# Patient Record
Sex: Female | Born: 1970 | Race: White | Hispanic: No | Marital: Married | Smoking: Current every day smoker
Health system: Southern US, Community
[De-identification: ages and names within clinical notes are randomized; demographics above are authoritative.]

## PROBLEM LIST (undated history)

## (undated) DIAGNOSIS — N63 Unspecified lump in unspecified breast: Secondary | ICD-10-CM

## (undated) DIAGNOSIS — E079 Disorder of thyroid, unspecified: Secondary | ICD-10-CM

## (undated) DIAGNOSIS — R0683 Snoring: Secondary | ICD-10-CM

## (undated) HISTORY — PX: NO PAST SURGERIES: SHX2092

## (undated) HISTORY — DX: Disorder of thyroid, unspecified: E07.9

## (undated) HISTORY — DX: Unspecified lump in unspecified breast: N63.0

---

## 2002-12-28 ENCOUNTER — Emergency Department (HOSPITAL_COMMUNITY): Admission: EM | Admit: 2002-12-28 | Discharge: 2002-12-28 | Payer: Self-pay | Admitting: Emergency Medicine

## 2002-12-28 ENCOUNTER — Encounter: Payer: Self-pay | Admitting: Emergency Medicine

## 2005-07-19 ENCOUNTER — Encounter (HOSPITAL_COMMUNITY): Admission: RE | Admit: 2005-07-19 | Discharge: 2005-10-17 | Payer: Self-pay | Admitting: Endocrinology

## 2007-02-07 ENCOUNTER — Emergency Department (HOSPITAL_COMMUNITY): Admission: EM | Admit: 2007-02-07 | Discharge: 2007-02-07 | Payer: Self-pay | Admitting: Emergency Medicine

## 2010-06-27 ENCOUNTER — Encounter: Payer: Self-pay | Admitting: Endocrinology

## 2010-09-10 ENCOUNTER — Emergency Department (HOSPITAL_COMMUNITY): Payer: Self-pay

## 2010-09-10 ENCOUNTER — Emergency Department (HOSPITAL_COMMUNITY)
Admission: EM | Admit: 2010-09-10 | Discharge: 2010-09-11 | Disposition: A | Discharge status: Home / Self Care | Payer: Self-pay | Attending: Emergency Medicine | Admitting: Emergency Medicine

## 2010-09-10 DIAGNOSIS — J189 Pneumonia, unspecified organism: Secondary | ICD-10-CM | POA: Insufficient documentation

## 2010-09-10 DIAGNOSIS — Z7982 Long term (current) use of aspirin: Secondary | ICD-10-CM | POA: Insufficient documentation

## 2010-09-10 DIAGNOSIS — F17200 Nicotine dependence, unspecified, uncomplicated: Secondary | ICD-10-CM | POA: Insufficient documentation

## 2010-09-10 DIAGNOSIS — R079 Chest pain, unspecified: Secondary | ICD-10-CM | POA: Insufficient documentation

## 2010-09-10 DIAGNOSIS — M549 Dorsalgia, unspecified: Secondary | ICD-10-CM | POA: Insufficient documentation

## 2010-09-10 DIAGNOSIS — E0500 Thyrotoxicosis with diffuse goiter without thyrotoxic crisis or storm: Secondary | ICD-10-CM | POA: Insufficient documentation

## 2010-09-10 LAB — POCT CARDIAC MARKERS
Myoglobin, poc: 32.8 ng/mL (ref 12–200)
Troponin i, poc: <0.05 ng/mL (ref 0.00–0.09)

## 2010-09-10 LAB — POCT I-STAT, CHEM 8
BUN: 12 mg/dL (ref 6–23)
Calcium, Ion: 1.23 mmol/L (ref 1.12–1.32)
Creatinine, Ser: 0.8 mg/dL (ref 0.4–1.2)
Glucose, Bld: 115 mg/dL — ABNORMAL HIGH (ref 70–99)
Hemoglobin: 14.3 g/dL (ref 12.0–15.0)
Potassium: 3.6 mEq/L (ref 3.5–5.1)
TCO2: 26 mmol/L (ref 0–100)

## 2010-09-11 LAB — POCT CARDIAC MARKERS

## 2013-05-07 ENCOUNTER — Other Ambulatory Visit: Payer: Self-pay | Admitting: Family Medicine

## 2013-05-07 DIAGNOSIS — Z1231 Encounter for screening mammogram for malignant neoplasm of breast: Secondary | ICD-10-CM

## 2013-06-24 ENCOUNTER — Ambulatory Visit
Admission: RE | Admit: 2013-06-24 | Discharge: 2013-06-24 | Disposition: A | Discharge status: Home / Self Care | Payer: Self-pay | Source: Ambulatory Visit | Attending: Family Medicine | Admitting: Family Medicine

## 2013-06-24 DIAGNOSIS — Z1231 Encounter for screening mammogram for malignant neoplasm of breast: Secondary | ICD-10-CM

## 2013-06-26 ENCOUNTER — Other Ambulatory Visit: Payer: Self-pay | Admitting: Family Medicine

## 2013-06-26 DIAGNOSIS — R928 Other abnormal and inconclusive findings on diagnostic imaging of breast: Secondary | ICD-10-CM

## 2013-07-05 ENCOUNTER — Ambulatory Visit
Admission: RE | Admit: 2013-07-05 | Discharge: 2013-07-05 | Disposition: A | Discharge status: Home / Self Care | Payer: BC Managed Care – PPO | Source: Ambulatory Visit | Attending: Family Medicine | Admitting: Family Medicine

## 2013-07-05 DIAGNOSIS — R928 Other abnormal and inconclusive findings on diagnostic imaging of breast: Secondary | ICD-10-CM

## 2013-07-22 ENCOUNTER — Telehealth (INDEPENDENT_AMBULATORY_CARE_PROVIDER_SITE_OTHER): Payer: Self-pay | Admitting: General Surgery

## 2013-07-22 ENCOUNTER — Encounter (INDEPENDENT_AMBULATORY_CARE_PROVIDER_SITE_OTHER): Payer: Self-pay

## 2013-07-22 ENCOUNTER — Encounter (INDEPENDENT_AMBULATORY_CARE_PROVIDER_SITE_OTHER): Payer: Self-pay | Admitting: General Surgery

## 2013-07-22 ENCOUNTER — Ambulatory Visit (INDEPENDENT_AMBULATORY_CARE_PROVIDER_SITE_OTHER): Payer: BC Managed Care – PPO | Admitting: General Surgery

## 2013-07-22 VITALS — BP 132/86 | HR 78 | Temp 98.1°F | Resp 16 | Ht 65.0 in | Wt 161.6 lb

## 2013-07-22 DIAGNOSIS — D241 Benign neoplasm of right breast: Secondary | ICD-10-CM

## 2013-07-22 DIAGNOSIS — D249 Benign neoplasm of unspecified breast: Secondary | ICD-10-CM

## 2013-07-22 MED ORDER — CEFAZOLIN SODIUM 1 G IJ SOLR: 2.0000 g | INTRAMUSCULAR | Status: DC

## 2013-07-22 NOTE — Addendum Note (Signed)
Addended by: Doreen Salvage on: 07/22/2013 12:30 PM   Modules accepted: Orders

## 2013-07-22 NOTE — Progress Notes (Signed)
Subjective:     Patient ID: Beverly Mitchell, female   DOB: [birth year], 43 y.o.   MRN: [id number]  HPI The patient comes in for evaluation of a lump in her right breast. The patient had felt something in that area for a while however he was not very discrete. Subsequent workup demonstrated likely fibroadenoma. She is here for evaluation.  She has no family history of breast disease.  Review of Systems No history of nipple discharge, dimpling, or significant problems with her breasts. She does have more pain in her right breast in her left recently.    Objective:   Physical Exam Bilateral breast examination demonstrates a lateral fibrocystic change was significantly more tenderness on the right side. There is no inversion of the nipple. No peau d'orange orange change.  She has no axillary adenopathy. There is no discrete mass palpated on either breast.    Assessment:     Normal fibroadenoma of the right breast without discrete palpation of the same mass in the right breast.     Plan:     If the patient wants this mass removed then a wire localization will be necessary.  The patient wants to have the mass removed. A wire localization biopsy will be necessary. This will be scheduled as an outpatient as soon as possible.

## 2013-07-22 NOTE — Telephone Encounter (Signed)
Patient met with surgery scheduling went over financial responsibilities , patient will call back to surgery

## 2013-08-14 ENCOUNTER — Other Ambulatory Visit (INDEPENDENT_AMBULATORY_CARE_PROVIDER_SITE_OTHER): Payer: Self-pay | Admitting: General Surgery

## 2013-08-14 DIAGNOSIS — D241 Benign neoplasm of right breast: Secondary | ICD-10-CM

## 2013-09-13 ENCOUNTER — Encounter (HOSPITAL_BASED_OUTPATIENT_CLINIC_OR_DEPARTMENT_OTHER): Payer: Self-pay | Admitting: *Deleted

## 2013-09-13 NOTE — Progress Notes (Signed)
To come in for [facility]-labs,cxr,ekg

## 2013-09-16 ENCOUNTER — Ambulatory Visit
Admission: RE | Admit: 2013-09-16 | Discharge: 2013-09-16 | Disposition: A | Discharge status: Home / Self Care | Payer: BC Managed Care – PPO | Source: Ambulatory Visit | Attending: General Surgery | Admitting: General Surgery

## 2013-09-16 ENCOUNTER — Encounter (HOSPITAL_BASED_OUTPATIENT_CLINIC_OR_DEPARTMENT_OTHER): Payer: Self-pay | Admitting: *Deleted

## 2013-09-16 ENCOUNTER — Ambulatory Visit (HOSPITAL_COMMUNITY): Admission: RE | Admit: 2013-09-16 | Payer: BC Managed Care – PPO | Source: Ambulatory Visit

## 2013-09-16 ENCOUNTER — Encounter (HOSPITAL_BASED_OUTPATIENT_CLINIC_OR_DEPARTMENT_OTHER)
Admission: RE | Admit: 2013-09-16 | Discharge: 2013-09-16 | Disposition: A | Discharge status: Home / Self Care | Payer: BC Managed Care – PPO | Source: Ambulatory Visit | Attending: General Surgery | Admitting: General Surgery

## 2013-09-16 ENCOUNTER — Other Ambulatory Visit (INDEPENDENT_AMBULATORY_CARE_PROVIDER_SITE_OTHER): Payer: Self-pay | Admitting: General Surgery

## 2013-09-16 DIAGNOSIS — Z01811 Encounter for preprocedural respiratory examination: Secondary | ICD-10-CM

## 2013-09-16 DIAGNOSIS — Z01812 Encounter for preprocedural laboratory examination: Secondary | ICD-10-CM | POA: Insufficient documentation

## 2013-09-16 DIAGNOSIS — Z01810 Encounter for preprocedural cardiovascular examination: Secondary | ICD-10-CM | POA: Insufficient documentation

## 2013-09-16 LAB — CBC WITH DIFFERENTIAL/PLATELET
Basophils Absolute: 0 10*3/uL (ref 0.0–0.1)
Basophils Relative: 0 % (ref 0–1)
EOS ABS: 0 10*3/uL (ref 0.0–0.7)
Eosinophils Relative: 0 % (ref 0–5)
HCT: 42.7 % (ref 36.0–46.0)
Hemoglobin: 14.8 g/dL (ref 12.0–15.0)
LYMPHS ABS: 2.9 10*3/uL (ref 0.7–4.0)
LYMPHS PCT: 27 % (ref 12–46)
MCH: 31.9 pg (ref 26.0–34.0)
MCHC: 34.7 g/dL (ref 30.0–36.0)
MCV: 92 fL (ref 78.0–100.0)
Monocytes Absolute: 0.4 10*3/uL (ref 0.1–1.0)
Monocytes Relative: 4 % (ref 3–12)
NEUTROS PCT: 69 % (ref 43–77)
Neutro Abs: 7.6 10*3/uL (ref 1.7–7.7)
Platelets: 236 10*3/uL (ref 150–400)
RBC: 4.64 MIL/uL (ref 3.87–5.11)
RDW: 12.5 % (ref 11.5–15.5)
WBC: 11 10*3/uL — ABNORMAL HIGH (ref 4.0–10.5)

## 2013-09-16 LAB — BASIC METABOLIC PANEL
BUN: 6 mg/dL (ref 6–23)
CALCIUM: 9.4 mg/dL (ref 8.4–10.5)
CO2: 20 mEq/L (ref 19–32)
CREATININE: 0.66 mg/dL (ref 0.50–1.10)
Chloride: 101 mEq/L (ref 96–112)
Glucose, Bld: 109 mg/dL — ABNORMAL HIGH (ref 70–99)
Potassium: 3.6 mEq/L — ABNORMAL LOW (ref 3.7–5.3)
SODIUM: 140 meq/L (ref 137–147)

## 2013-09-16 NOTE — Progress Notes (Signed)
Surgery r/s for 4/14

## 2013-09-17 ENCOUNTER — Other Ambulatory Visit (INDEPENDENT_AMBULATORY_CARE_PROVIDER_SITE_OTHER): Payer: Self-pay | Admitting: General Surgery

## 2013-09-17 ENCOUNTER — Ambulatory Visit
Admission: RE | Admit: 2013-09-17 | Discharge: 2013-09-17 | Disposition: A | Discharge status: Home / Self Care | Payer: BC Managed Care – PPO | Source: Ambulatory Visit | Attending: General Surgery | Admitting: General Surgery

## 2013-09-17 ENCOUNTER — Ambulatory Visit (HOSPITAL_BASED_OUTPATIENT_CLINIC_OR_DEPARTMENT_OTHER): Admission: RE | Admit: 2013-09-17 | Payer: BC Managed Care – PPO | Source: Ambulatory Visit | Admitting: General Surgery

## 2013-09-17 DIAGNOSIS — D241 Benign neoplasm of right breast: Secondary | ICD-10-CM

## 2013-09-17 DIAGNOSIS — N6310 Unspecified lump in the right breast, unspecified quadrant: Secondary | ICD-10-CM

## 2013-09-17 SURGERY — BREAST LUMPECTOMY WITH NEEDLE LOCALIZATION
Anesthesia: General | Laterality: Right

## 2013-09-19 ENCOUNTER — Ambulatory Visit (HOSPITAL_BASED_OUTPATIENT_CLINIC_OR_DEPARTMENT_OTHER): Admission: RE | Admit: 2013-09-19 | Payer: BC Managed Care – PPO | Source: Ambulatory Visit | Admitting: General Surgery

## 2013-09-19 HISTORY — DX: Snoring: R06.83

## 2013-09-19 SURGERY — BREAST LUMPECTOMY WITH NEEDLE LOCALIZATION
Anesthesia: General | Laterality: Right

## 2013-09-19 SURGERY — BREAST BIOPSY WITH NEEDLE LOCALIZATION
Anesthesia: General | Laterality: Right

## 2013-10-01 ENCOUNTER — Encounter (INDEPENDENT_AMBULATORY_CARE_PROVIDER_SITE_OTHER): Payer: BC Managed Care – PPO | Admitting: General Surgery

## 2013-11-26 ENCOUNTER — Other Ambulatory Visit: Payer: Self-pay | Admitting: Family Medicine

## 2013-11-26 DIAGNOSIS — N63 Unspecified lump in unspecified breast: Secondary | ICD-10-CM

## 2014-01-20 ENCOUNTER — Other Ambulatory Visit: Payer: BC Managed Care – PPO

## 2014-02-28 ENCOUNTER — Encounter (INDEPENDENT_AMBULATORY_CARE_PROVIDER_SITE_OTHER): Payer: Self-pay

## 2014-02-28 ENCOUNTER — Ambulatory Visit
Admission: RE | Admit: 2014-02-28 | Discharge: 2014-02-28 | Disposition: A | Discharge status: Home / Self Care | Payer: BC Managed Care – PPO | Source: Ambulatory Visit | Attending: Orthopedic Surgery | Admitting: Orthopedic Surgery

## 2014-02-28 ENCOUNTER — Other Ambulatory Visit: Payer: Self-pay | Admitting: Orthopedic Surgery

## 2014-02-28 DIAGNOSIS — M545 Low back pain: Secondary | ICD-10-CM

## 2014-03-03 ENCOUNTER — Ambulatory Visit
Admission: RE | Admit: 2014-03-03 | Discharge: 2014-03-03 | Disposition: A | Discharge status: Home / Self Care | Payer: BC Managed Care – PPO | Source: Ambulatory Visit | Attending: Family Medicine | Admitting: Family Medicine

## 2014-03-03 ENCOUNTER — Encounter (INDEPENDENT_AMBULATORY_CARE_PROVIDER_SITE_OTHER): Payer: Self-pay

## 2014-03-03 DIAGNOSIS — N63 Unspecified lump in unspecified breast: Secondary | ICD-10-CM

## 2014-03-25 ENCOUNTER — Other Ambulatory Visit: Payer: Self-pay | Admitting: Family Medicine

## 2014-03-25 DIAGNOSIS — N6310 Unspecified lump in the right breast, unspecified quadrant: Secondary | ICD-10-CM

## 2014-06-25 ENCOUNTER — Other Ambulatory Visit: Payer: BC Managed Care – PPO

## 2014-06-25 ENCOUNTER — Inpatient Hospital Stay: Admission: RE | Admit: 2014-06-25 | Payer: BC Managed Care – PPO | Source: Ambulatory Visit

## 2014-07-02 ENCOUNTER — Ambulatory Visit
Admission: RE | Admit: 2014-07-02 | Discharge: 2014-07-02 | Disposition: A | Discharge status: Home / Self Care | Payer: BLUE CROSS/BLUE SHIELD | Source: Ambulatory Visit | Attending: Family Medicine | Admitting: Family Medicine

## 2014-07-02 DIAGNOSIS — N6310 Unspecified lump in the right breast, unspecified quadrant: Secondary | ICD-10-CM

## 2014-10-22 ENCOUNTER — Ambulatory Visit (INDEPENDENT_AMBULATORY_CARE_PROVIDER_SITE_OTHER): Payer: BLUE CROSS/BLUE SHIELD | Admitting: Endocrinology

## 2014-10-22 ENCOUNTER — Encounter: Payer: Self-pay | Admitting: Endocrinology

## 2014-10-22 VITALS — BP 128/90 | HR 79 | Temp 98.4°F | Ht 65.0 in | Wt 167.0 lb

## 2014-10-22 DIAGNOSIS — E890 Postprocedural hypothyroidism: Secondary | ICD-10-CM | POA: Diagnosis not present

## 2014-10-22 LAB — TSH: TSH: 1.22 u[IU]/mL (ref 0.35–4.50)

## 2014-10-22 NOTE — Patient Instructions (Addendum)
blood tests are requested for you today.  We'll let you know about the results. Your thyroid may even become overactive again, si it is good that Dr Chapman Fitch is following this.  Adjusting the thyroid pill can help the eye symptoms somewhat, but treatment of the Grave's eye condition is really difficult, and has side-effects (steroids, surgery, adn radiation to the eyes).   I would be happy to see you back here whenever you want.

## 2014-10-22 NOTE — Progress Notes (Signed)
Subjective:    Patient ID: Beverly Mitchell, female    DOB: [birth year], 44 y.o.   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  She has been on synthroid since soon thereafter.  She now reports moderate swelling of the eyes, and assoc anxiety. she has never had XRT to the anterior neck, or thyroid surgery.  she does not consume kelp or any other non-prescribed thyroid medication.  she has never been on amiodarone.   Past Medical History  Diagnosis Date  . Thyroid disease     hyperthyroidism  . Breast mass   . Snores     Past Surgical History  Procedure Laterality Date  . No past surgeries      History   Social History  . Marital Status: Married    Spouse Name: N/A  . Number of Children: N/A  . Years of Education: N/A   Occupational History  . Not on file.   Social History Main Topics  . Smoking status: Current Every Day Smoker -- 1.50 packs/day    Types: Cigarettes  . Smokeless tobacco: Not on file  . Alcohol Use: No  . Drug Use: No  . Sexual Activity: Not on file   Other Topics Concern  . Not on file   Social History Narrative    Current Outpatient Prescriptions on File Prior to Visit  Medication Sig Dispense Refill  . levothyroxine (SYNTHROID, LEVOTHROID) 75 MCG tablet 75 mcg. Take 1 tablet 6 days a week (skips Saturday)     No current facility-administered medications on file prior to visit.    Allergies  Allergen Reactions  . Prednisone     Pain in hands    No family history on file.  BP 128/90 mmHg  Pulse 79  Temp(Src) 98.4 F (36.9 C) (Oral)  Ht 5\' 5"  (1.651 m)  Wt 167 lb (75.751 kg)  BMI 27.79 kg/m2  SpO2 98%    Review of Systems denies headache, hoarseness, sob, myalgias, excessive diaphoresis, numbness, tremor, easy bruising, and rhinorrhea.  She has fatigue, urinary frequency, insomnia, intermittent diarrhea, intermittent diplopia, weight gain, palpitations, and irreg menses.      Objective:   Physical Exam VS: see vs page GEN: no distress HEAD: head: no deformity eyes: no periorbital swelling; moderate bilateral proptosis external nose and ears are normal mouth: no lesion seen NECK: supple, thyroid is not enlarged CHEST WALL: no deformity.  LUNGS:  Clear to auscultation.  CV: reg rate and rhythm, no murmur. ABD: abdomen is soft, nontender.  no hepatosplenomegaly.  not distended.  no hernia.   MUSCULOSKELETAL: muscle bulk and strength are grossly normal.  no obvious joint swelling.  gait is normal and steady EXTEMITIES: no deformity.  no edema.  PULSES: no carotid bruit NEURO:  cn 2-12 grossly intact.   readily moves all 4's.  sensation is intact to touch on all 4's.  No tremor SKIN:  Normal texture and temperature.  No rash or suspicious lesion is visible.  Not diaphoretic.   NODES:  None palpable at the neck.   PSYCH: alert, well-oriented.  Does not appear anxious nor depressed.    outside test results are reviewed: T4=11.4  Today:  Lab Results  Component Value Date   TSH 1.22 10/22/2014      Assessment & Plan:  Post-I-131 hypothyroidism: well-controlled.  This dosage indicates that the I-131 ablation was not complete.  Therefore, she may be better with checking TSH twice  a year, rather than once, but I'll leave this to Dr Chapman Fitch. 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    Patient is advised the following: Patient Instructions  blood tests are requested for you today.  We'll let you know about the results. Your thyroid may even become overactive again, si it is good that Dr Chapman Fitch is following this.  Adjusting the thyroid pill can help the eye symptoms somewhat, but treatment of the Grave's eye condition is really difficult, and has side-effects (steroids, surgery, adn radiation to the eyes).   I would be happy to see you back here whenever you want.    addendum: continue the same thyroid medication. Please continue to have the thyroid checked 1-2 times per year, with Dr  Chapman Fitch.

## 2015-05-28 ENCOUNTER — Other Ambulatory Visit: Payer: Self-pay | Admitting: Family Medicine

## 2015-05-28 DIAGNOSIS — N6310 Unspecified lump in the right breast, unspecified quadrant: Secondary | ICD-10-CM

## 2015-07-06 ENCOUNTER — Ambulatory Visit
Admission: RE | Admit: 2015-07-06 | Discharge: 2015-07-06 | Disposition: A | Discharge status: Home / Self Care | Payer: BLUE CROSS/BLUE SHIELD | Source: Ambulatory Visit | Attending: Family Medicine | Admitting: Family Medicine

## 2015-07-06 DIAGNOSIS — N6310 Unspecified lump in the right breast, unspecified quadrant: Secondary | ICD-10-CM

## 2016-07-06 ENCOUNTER — Other Ambulatory Visit: Payer: Self-pay | Admitting: Family

## 2016-07-06 DIAGNOSIS — Z1231 Encounter for screening mammogram for malignant neoplasm of breast: Secondary | ICD-10-CM

## 2016-08-08 ENCOUNTER — Ambulatory Visit
Admission: RE | Admit: 2016-08-08 | Discharge: 2016-08-08 | Disposition: A | Discharge status: Home / Self Care | Payer: BLUE CROSS/BLUE SHIELD | Source: Ambulatory Visit | Attending: Family | Admitting: Family

## 2016-08-08 DIAGNOSIS — Z1231 Encounter for screening mammogram for malignant neoplasm of breast: Secondary | ICD-10-CM

## 2017-07-05 ENCOUNTER — Other Ambulatory Visit: Payer: Self-pay | Admitting: Physician Assistant

## 2017-07-05 DIAGNOSIS — Z1231 Encounter for screening mammogram for malignant neoplasm of breast: Secondary | ICD-10-CM

## 2017-08-09 ENCOUNTER — Ambulatory Visit: Payer: BLUE CROSS/BLUE SHIELD

## 2017-08-21 ENCOUNTER — Ambulatory Visit: Payer: BLUE CROSS/BLUE SHIELD

## 2017-08-28 ENCOUNTER — Ambulatory Visit: Payer: BLUE CROSS/BLUE SHIELD

## 2017-09-11 ENCOUNTER — Ambulatory Visit
Admission: RE | Admit: 2017-09-11 | Discharge: 2017-09-11 | Disposition: A | Discharge status: Home / Self Care | Payer: BLUE CROSS/BLUE SHIELD | Source: Ambulatory Visit | Attending: Physician Assistant | Admitting: Physician Assistant

## 2017-09-11 DIAGNOSIS — Z1231 Encounter for screening mammogram for malignant neoplasm of breast: Secondary | ICD-10-CM

## 2018-12-17 ENCOUNTER — Telehealth: Payer: Self-pay | Admitting: Physical Medicine and Rehabilitation

## 2018-12-17 NOTE — Telephone Encounter (Signed)
Patient left a message requesting an appointment with Dr. Ernestina Patches. She last saw him in . Left message for patient to call back to schedule an office visit.

## 2019-07-01 ENCOUNTER — Other Ambulatory Visit: Payer: Self-pay | Admitting: Physician Assistant

## 2019-07-01 DIAGNOSIS — Z1231 Encounter for screening mammogram for malignant neoplasm of breast: Secondary | ICD-10-CM

## 2019-08-05 ENCOUNTER — Ambulatory Visit: Payer: BLUE CROSS/BLUE SHIELD

## 2019-08-12 ENCOUNTER — Ambulatory Visit: Payer: BLUE CROSS/BLUE SHIELD

## 2019-09-05 ENCOUNTER — Ambulatory Visit
Admission: RE | Admit: 2019-09-05 | Discharge: 2019-09-05 | Disposition: A | Discharge status: Home / Self Care | Payer: BC Managed Care – PPO | Source: Ambulatory Visit | Attending: Physician Assistant | Admitting: Physician Assistant

## 2019-09-05 ENCOUNTER — Other Ambulatory Visit: Payer: Self-pay

## 2019-09-05 DIAGNOSIS — Z1231 Encounter for screening mammogram for malignant neoplasm of breast: Secondary | ICD-10-CM

## 2020-08-19 ENCOUNTER — Other Ambulatory Visit: Payer: Self-pay | Admitting: Physician Assistant

## 2020-08-19 DIAGNOSIS — Z1231 Encounter for screening mammogram for malignant neoplasm of breast: Secondary | ICD-10-CM

## 2020-10-13 ENCOUNTER — Other Ambulatory Visit: Payer: Self-pay

## 2020-10-13 ENCOUNTER — Ambulatory Visit
Admission: RE | Admit: 2020-10-13 | Discharge: 2020-10-13 | Disposition: A | Discharge status: Home / Self Care | Payer: BC Managed Care – PPO | Source: Ambulatory Visit | Attending: Physician Assistant | Admitting: Physician Assistant

## 2020-10-13 DIAGNOSIS — Z1231 Encounter for screening mammogram for malignant neoplasm of breast: Secondary | ICD-10-CM

## 2023-05-08 IMAGING — MG MM DIGITAL SCREENING BILAT W/ TOMO AND CAD
8 series · 9 of 24 positions shown · non-contrast
Comparison: Previous exam(s).

CLINICAL DATA: Screening.

EXAM:
DIGITAL SCREENING BILATERAL MAMMOGRAM WITH TOMOSYNTHESIS AND CAD
TECHNIQUE: Bilateral screening digital craniocaudal and mediolateral oblique
mammograms were obtained. Bilateral screening digital breast
tomosynthesis was performed. The images were evaluated with
computer-aided detection.

[R CC synth-2D]
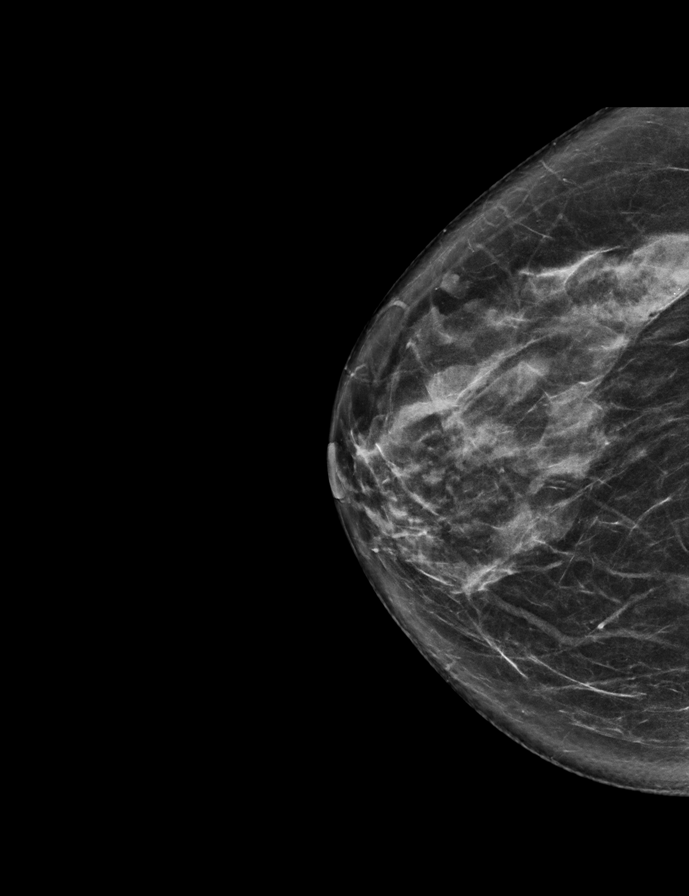

[R MLO synth-2D]
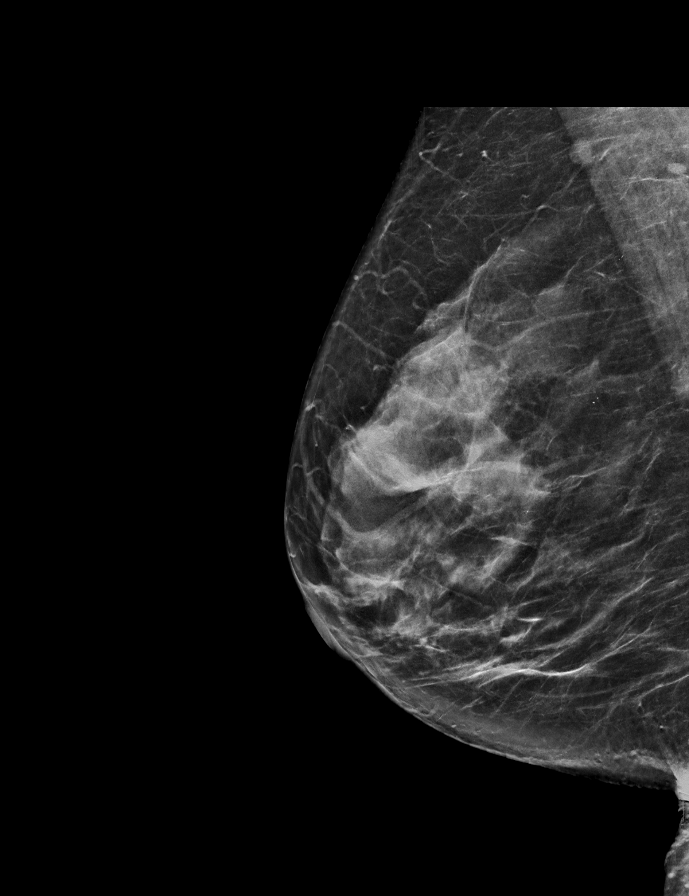

[L CC synth-2D]
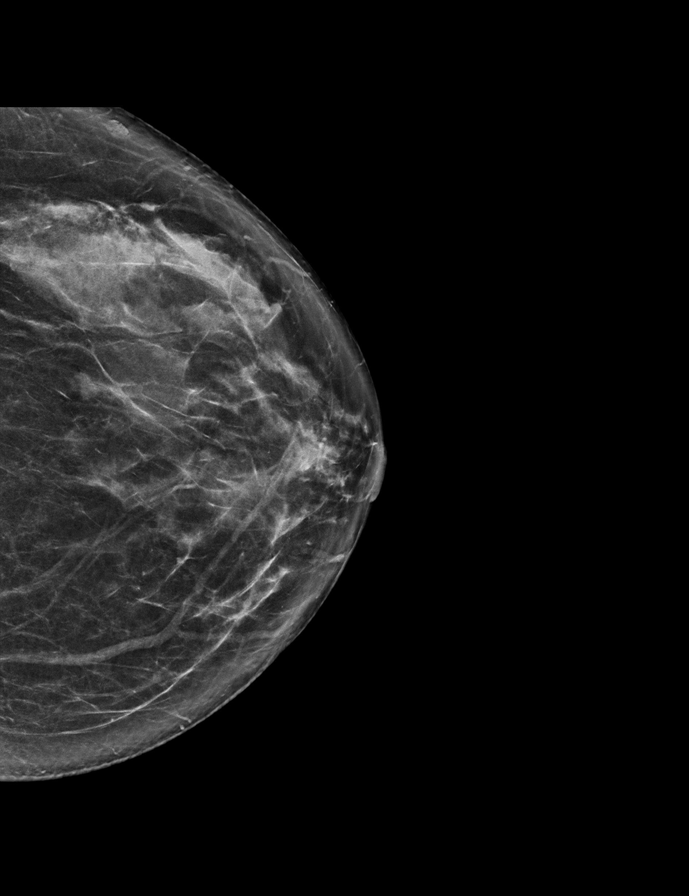

[L MLO synth-2D]
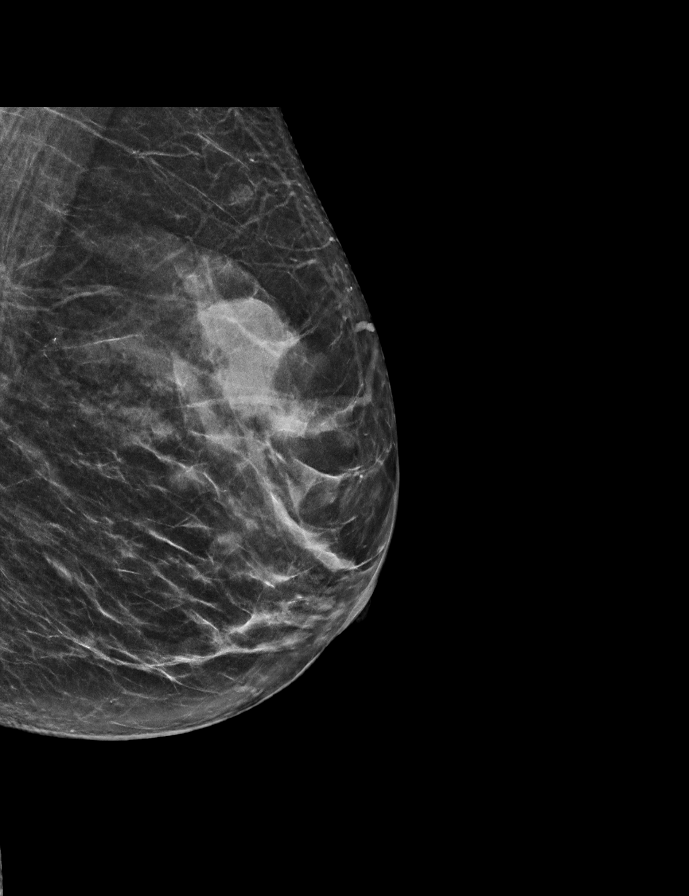

[R CC tomo · 2 of 70 frames shown]
[frame 23/70]
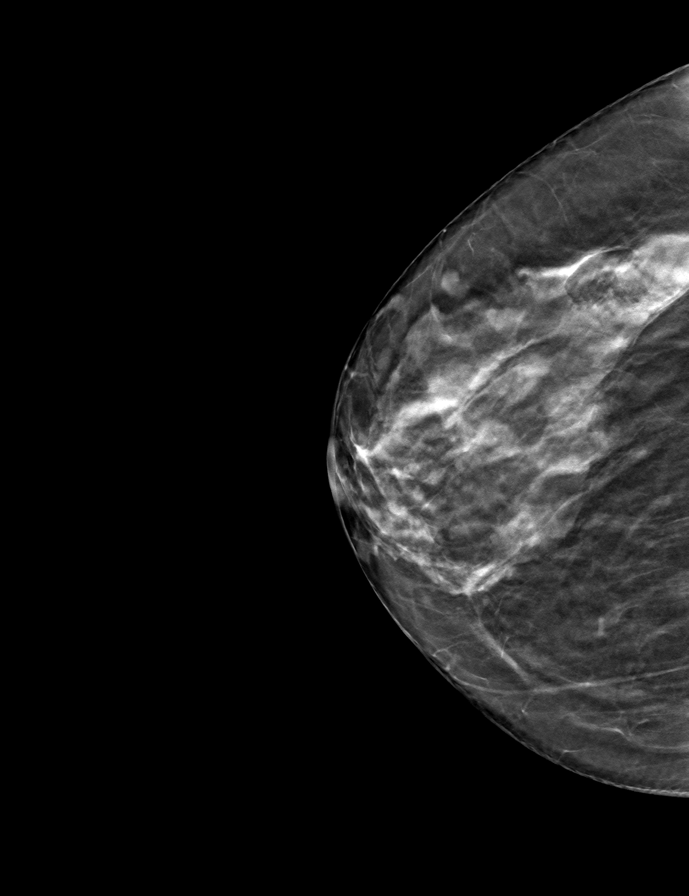
[frame 35/70]
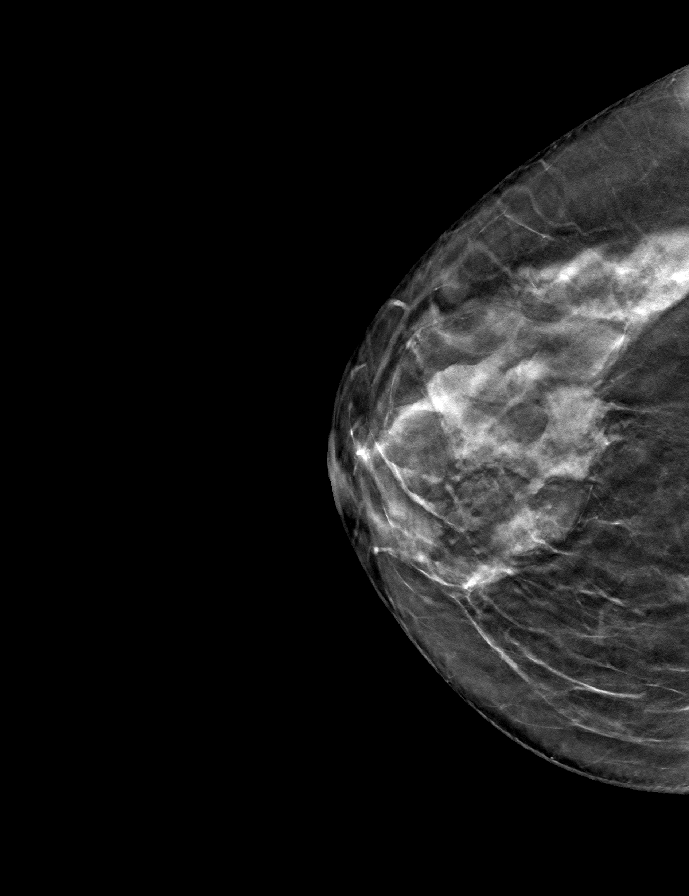

[L CC tomo · tomo slice 33/66.0]
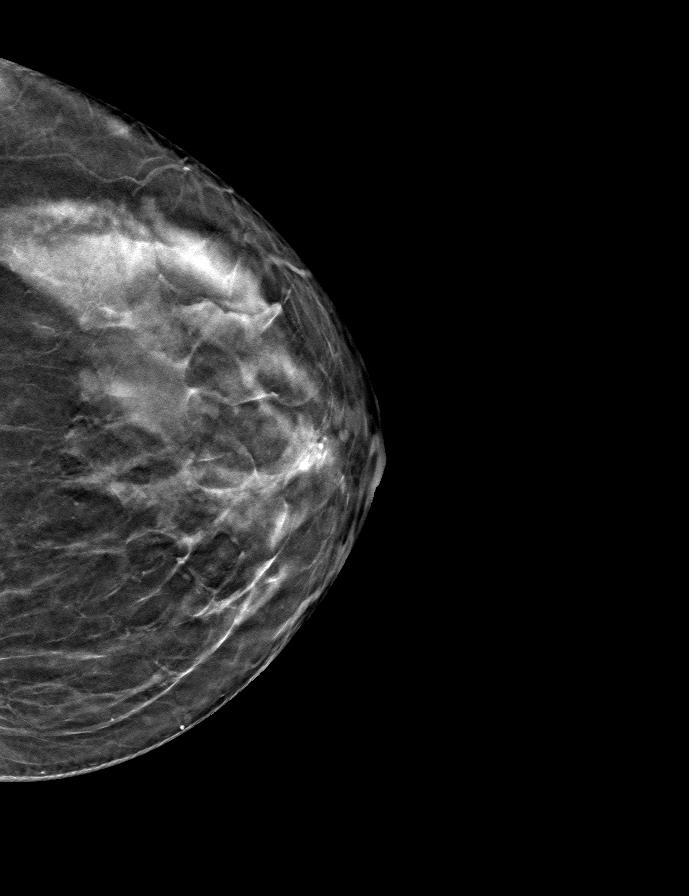

[R MLO tomo · tomo slice 35/69.0]
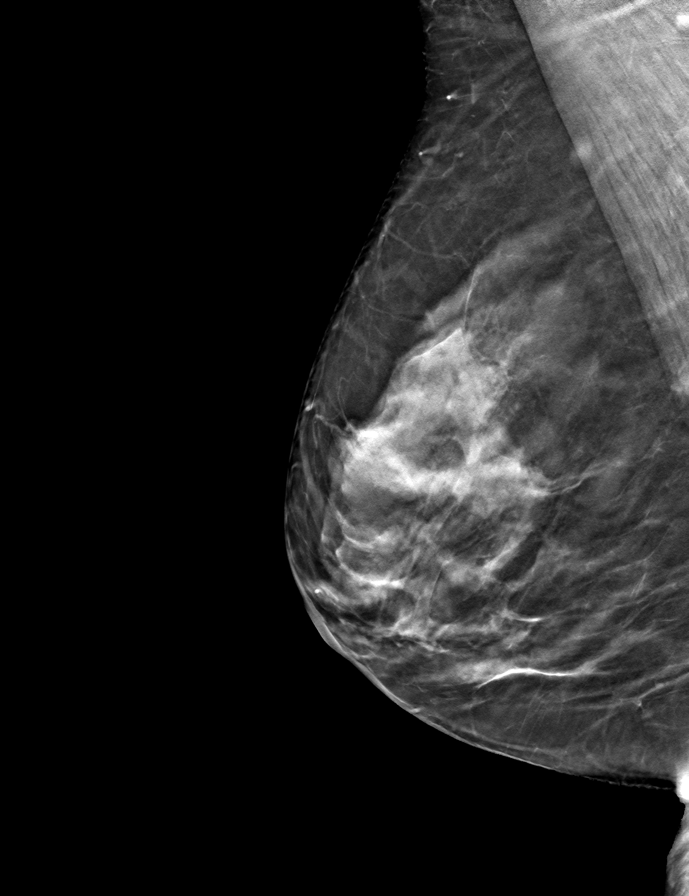

[L MLO tomo · tomo slice 32/63.0]
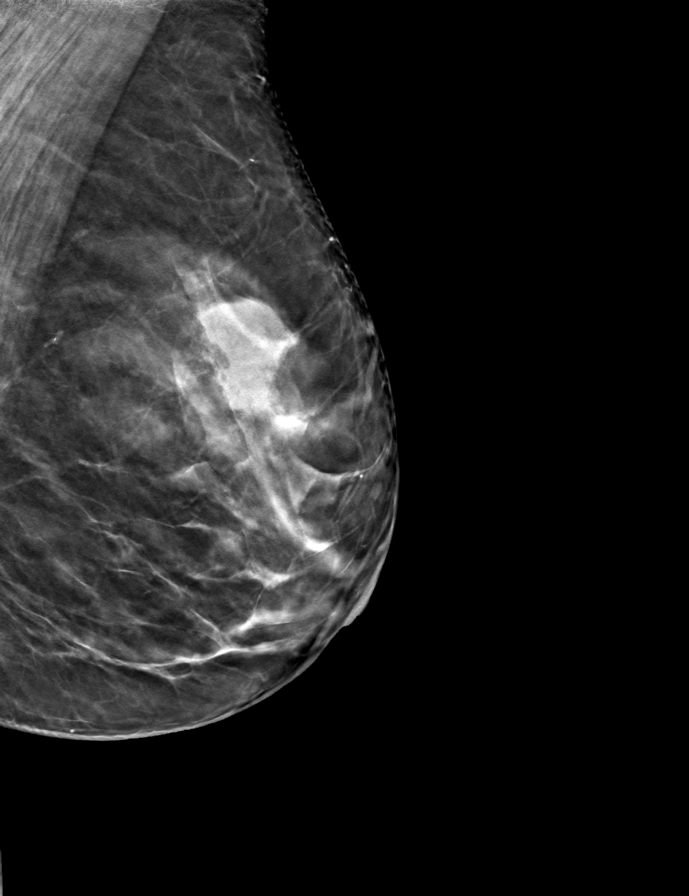

[9 of 24 positions shown; findings below may reference images not displayed]

ACR Breast Density Category c: The breast tissue is heterogeneously
dense, which may obscure small masses.
FINDINGS: There are no findings suspicious for malignancy. The images were
evaluated with computer-aided detection.
IMPRESSION: No mammographic evidence of malignancy. A result letter of this
screening mammogram will be mailed directly to the patient.

RECOMMENDATION:
Screening mammogram in one year. (Code:T4-5-GWO)

BI-RADS CATEGORY  1: Negative.

## 2024-04-29 ENCOUNTER — Other Ambulatory Visit: Payer: Self-pay | Admitting: Physician Assistant

## 2024-04-29 DIAGNOSIS — Z1231 Encounter for screening mammogram for malignant neoplasm of breast: Secondary | ICD-10-CM

## 2024-05-27 ENCOUNTER — Ambulatory Visit

## 2024-06-14 ENCOUNTER — Ambulatory Visit

## 2024-06-14 ENCOUNTER — Ambulatory Visit
Admission: RE | Admit: 2024-06-14 | Discharge: 2024-06-14 | Disposition: A | Source: Ambulatory Visit | Attending: Physician Assistant | Admitting: Physician Assistant

## 2024-06-14 DIAGNOSIS — Z1231 Encounter for screening mammogram for malignant neoplasm of breast: Secondary | ICD-10-CM
